# Patient Record
Sex: Female | Born: 1965 | Race: White | Hispanic: No | Marital: Single | Smoking: Never smoker
Health system: Southern US, Community
[De-identification: ages and names within clinical notes are randomized; demographics above are authoritative.]

## PROBLEM LIST (undated history)

## (undated) DIAGNOSIS — F32A Depression, unspecified: Secondary | ICD-10-CM

## (undated) DIAGNOSIS — F329 Major depressive disorder, single episode, unspecified: Secondary | ICD-10-CM

## (undated) HISTORY — DX: Depression, unspecified: F32.A

## (undated) HISTORY — DX: Major depressive disorder, single episode, unspecified: F32.9

## (undated) HISTORY — PX: TUBAL LIGATION: SHX77

---

## 1999-01-28 ENCOUNTER — Other Ambulatory Visit: Admission: RE | Admit: 1999-01-28 | Discharge: 1999-01-28 | Payer: Self-pay | Admitting: Obstetrics and Gynecology

## 1999-05-11 ENCOUNTER — Emergency Department (HOSPITAL_COMMUNITY): Admission: EM | Admit: 1999-05-11 | Discharge: 1999-05-11 | Payer: Self-pay | Admitting: Emergency Medicine

## 1999-06-04 ENCOUNTER — Encounter: Payer: Self-pay | Admitting: Emergency Medicine

## 1999-06-04 ENCOUNTER — Emergency Department (HOSPITAL_COMMUNITY): Admission: EM | Admit: 1999-06-04 | Discharge: 1999-06-04 | Payer: Self-pay | Admitting: Emergency Medicine

## 2005-12-27 ENCOUNTER — Other Ambulatory Visit: Admission: RE | Admit: 2005-12-27 | Discharge: 2005-12-27 | Payer: Self-pay | Admitting: Family Medicine

## 2009-04-14 ENCOUNTER — Other Ambulatory Visit: Admission: RE | Admit: 2009-04-14 | Discharge: 2009-04-14 | Payer: Self-pay | Admitting: Family Medicine

## 2009-06-18 ENCOUNTER — Emergency Department (HOSPITAL_COMMUNITY): Admission: EM | Admit: 2009-06-18 | Discharge: 2009-06-18 | Payer: Self-pay | Admitting: Emergency Medicine

## 2014-01-11 ENCOUNTER — Other Ambulatory Visit: Payer: Self-pay | Admitting: Obstetrics and Gynecology

## 2014-01-11 DIAGNOSIS — R928 Other abnormal and inconclusive findings on diagnostic imaging of breast: Secondary | ICD-10-CM

## 2014-01-18 ENCOUNTER — Ambulatory Visit
Admission: RE | Admit: 2014-01-18 | Discharge: 2014-01-18 | Disposition: A | Discharge status: Home / Self Care | Payer: 59 | Source: Ambulatory Visit | Attending: Obstetrics and Gynecology | Admitting: Obstetrics and Gynecology

## 2014-01-18 ENCOUNTER — Other Ambulatory Visit: Payer: Self-pay | Admitting: Obstetrics and Gynecology

## 2014-01-18 DIAGNOSIS — R928 Other abnormal and inconclusive findings on diagnostic imaging of breast: Secondary | ICD-10-CM

## 2014-01-18 DIAGNOSIS — R921 Mammographic calcification found on diagnostic imaging of breast: Secondary | ICD-10-CM

## 2014-01-25 ENCOUNTER — Ambulatory Visit
Admission: RE | Admit: 2014-01-25 | Discharge: 2014-01-25 | Disposition: A | Discharge status: Home / Self Care | Payer: 59 | Source: Ambulatory Visit | Attending: Obstetrics and Gynecology | Admitting: Obstetrics and Gynecology

## 2014-01-25 DIAGNOSIS — R921 Mammographic calcification found on diagnostic imaging of breast: Secondary | ICD-10-CM

## 2014-01-25 HISTORY — PX: BREAST BIOPSY: SHX20

## 2015-12-16 ENCOUNTER — Other Ambulatory Visit: Payer: Self-pay | Admitting: Orthopedic Surgery

## 2015-12-16 DIAGNOSIS — M25571 Pain in right ankle and joints of right foot: Secondary | ICD-10-CM

## 2015-12-25 ENCOUNTER — Ambulatory Visit
Admission: RE | Admit: 2015-12-25 | Discharge: 2015-12-25 | Disposition: A | Discharge status: Home / Self Care | Payer: 59 | Source: Ambulatory Visit | Attending: Orthopedic Surgery | Admitting: Orthopedic Surgery

## 2015-12-25 DIAGNOSIS — M25571 Pain in right ankle and joints of right foot: Secondary | ICD-10-CM

## 2016-12-15 ENCOUNTER — Ambulatory Visit (INDEPENDENT_AMBULATORY_CARE_PROVIDER_SITE_OTHER): Payer: 59 | Admitting: Family Medicine

## 2016-12-15 DIAGNOSIS — M7660 Achilles tendinitis, unspecified leg: Secondary | ICD-10-CM

## 2016-12-15 NOTE — Progress Notes (Signed)
Chief complaint: Bilateral heel pain 1.5 years  History of present illness: Debra Boone is a 51 year old Caucasian female, who presents to the sports medicine office today with chief complaint of bilateral heel pain. She reports that symptoms started approximately 1.5 years ago. She reports of specific incidents as cause of pain. She reports that she was trying to lose weight, started jump roping and after the first swimming noted pain along the posterior aspect of both of her heels, preventing her to do further jump roping. She points to pain being worse in the right heel versus the left heel. She describes the pain as a sharp, jabbing, and knifelike burning pain. She reports continued intermittent pain since that time. She reports that she normally wears tennis shoes, does not wear any high heels. She reports that she cannot walk barefoot secondary to pain. She reports that she did have orthopedic evaluation, had x-ray imaging which showed no obvious bony abnormality, then had MRI which showed Achilles tendinopathy. She was given nitroglycerin patch, which she reports she used a whole patch here she reports she tried this for one month, with no improvement in symptoms. She reports that she tried any additional medication, unsure of the name of the medication, but reports no improvement in symptoms. She reports that her primary physician started her on gabapentin, initially at 100 mg, but now is at 300 mg. She reports no improvement in symptoms in regards to this. She does not report of any numbness, tingling, or weakness in her bilateral lower extremities. She does not report of any radiation of pain. Describes the pain as a 5/10. The reports of no interval improvement in symptoms or worsening of symptoms over the last 1.5 years. She does not report of any previous injury to her ankle or feet bilaterally. She does not report of any  warmth, erythema, ecchymosis, or effusion.  Past medical  history: Anxiety Depression Obesity GERD Vitamin D deficiency  Past surgical history: Tubal ligation   Family history: Does not report a family history hypertension, type 2 diabetes, hyperlipidemia mother or father's side of family  Social history: She is not report of any current tobacco, alcohol, or illicit drug use, is married, has 2 children, does mostly desk work for [employer]  Allergies: Paxil, rash  Review of systems: As stated above  Physical exam: Vital signs reviewed and are documented in chart General: Alert, oriented, appears stated age, in no apparent distress HEENT: Moist oral mucosa Respiratory: Normal respirations, able to speak in full sentences Cardiac: Normal peripheral pulses, regular rate Integumentary: No rashes on visible skin Neurologic: Strength 5/5 in bilateral lower extremity, sensation 2+ in bilateral lower extremities Psychiatric: Appropriate affect and mood Musculoskeletal: Inspection of bilateral feet reveals obvious Haglund deformity in bilateral feet, no obvious deformity or muscular atrophy, no warmth, erythema, ecchymosis, or effusion noted, she reports tenderness to deep palpation over the posterior aspect of calcaneus where the Achilles inserts into the calcaneus, no pain elicited with ankle dorsiflexion, plantar flexion, inversion, eversion, she does have normal range of motion of her ankle, no tenderness to palpation along the plantar aspect of her feet or dorsal aspect of her feet, as well as as an medial and lateral malleolus, calcaneal squeeze test negative, no pain along proximal Achilles and along the gastrocnemius and soleus muscles bilaterally  Assessment and plan: 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  Bilateral insertional Achilles tendinopathy -Discussed pain is most likely related to insertional Achilles tendinopathy, discussed scar tissue formation and  calcium deposit along the Achilles  were it inserts into the calcaneus, discussed this is origin of bilateral Haglund's deformity -Discussed heel lifts, will give 5/16th in the left -Discussed continue nitroglycerin protocol, discussed that she did not give the nitroglycerin enough time for full effectiveness, discussed cutting nitroglycerin patch into 1/4, rotating sites daily to minimize side effect of rash, discussed monitor for any symptoms of headaches discussed use for 3 months -Discussed eccentric exercises and heel lifts, to do 3 sets of 15 on each side daily -She will follow-up in 4-6 weeks, we'll plan for ultrasound at that time  Otherwise, she will return sooner on an as-needed basis.  Haynes Kernshristopher Lake, MD Primary Care Sports Medicine Fellow [facility]

## 2016-12-15 NOTE — Patient Instructions (Addendum)

## 2016-12-21 ENCOUNTER — Other Ambulatory Visit: Payer: Self-pay | Admitting: Obstetrics and Gynecology

## 2016-12-21 DIAGNOSIS — R921 Mammographic calcification found on diagnostic imaging of breast: Secondary | ICD-10-CM

## 2016-12-23 ENCOUNTER — Ambulatory Visit
Admission: RE | Admit: 2016-12-23 | Discharge: 2016-12-23 | Disposition: A | Discharge status: Home / Self Care | Payer: 59 | Source: Ambulatory Visit | Attending: Obstetrics and Gynecology | Admitting: Obstetrics and Gynecology

## 2016-12-23 DIAGNOSIS — R921 Mammographic calcification found on diagnostic imaging of breast: Secondary | ICD-10-CM

## 2017-01-12 ENCOUNTER — Ambulatory Visit: Payer: 59 | Admitting: Family Medicine

## 2017-11-28 ENCOUNTER — Other Ambulatory Visit: Payer: Self-pay | Admitting: Obstetrics and Gynecology

## 2017-11-28 DIAGNOSIS — Z1231 Encounter for screening mammogram for malignant neoplasm of breast: Secondary | ICD-10-CM

## 2018-01-02 ENCOUNTER — Ambulatory Visit
Admission: RE | Admit: 2018-01-02 | Discharge: 2018-01-02 | Disposition: A | Discharge status: Home / Self Care | Payer: 59 | Source: Ambulatory Visit | Attending: Obstetrics and Gynecology | Admitting: Obstetrics and Gynecology

## 2018-01-02 DIAGNOSIS — Z1231 Encounter for screening mammogram for malignant neoplasm of breast: Secondary | ICD-10-CM

## 2018-08-11 ENCOUNTER — Other Ambulatory Visit: Payer: Self-pay | Admitting: Family Medicine

## 2018-08-11 DIAGNOSIS — N183 Chronic kidney disease, stage 3 unspecified: Secondary | ICD-10-CM

## 2018-09-12 ENCOUNTER — Other Ambulatory Visit: Payer: 59

## 2018-12-18 IMAGING — MG DIGITAL SCREENING BILATERAL MAMMOGRAM WITH CAD
4 series · 4 of 4 positions shown · non-contrast
Comparison: Previous exam(s).

CLINICAL DATA: Screening.

EXAM:
DIGITAL SCREENING BILATERAL MAMMOGRAM WITH CAD

[L CC]
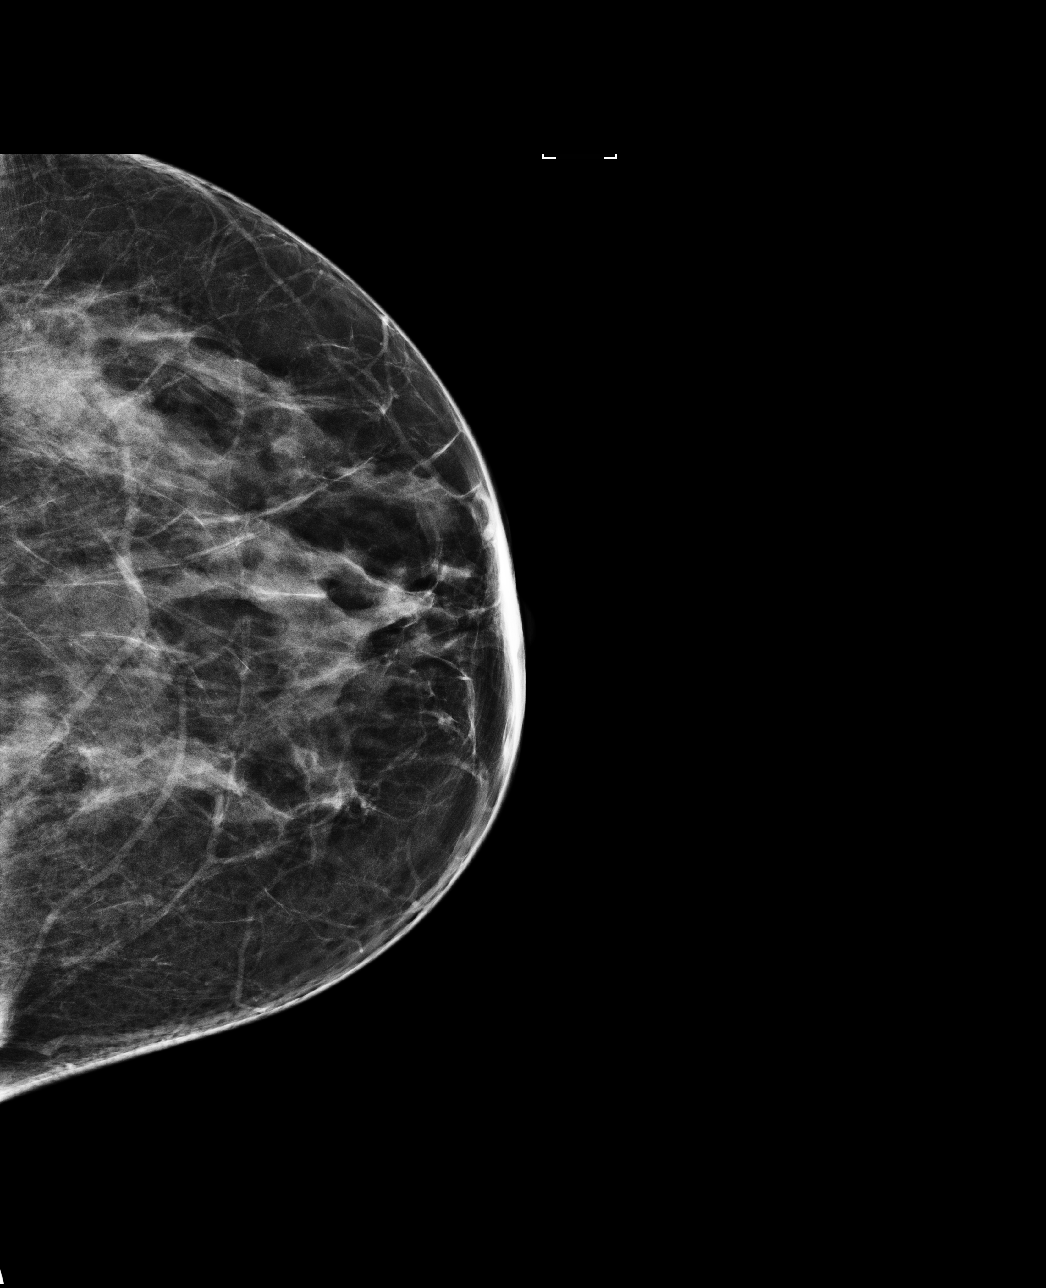

[R MLO]
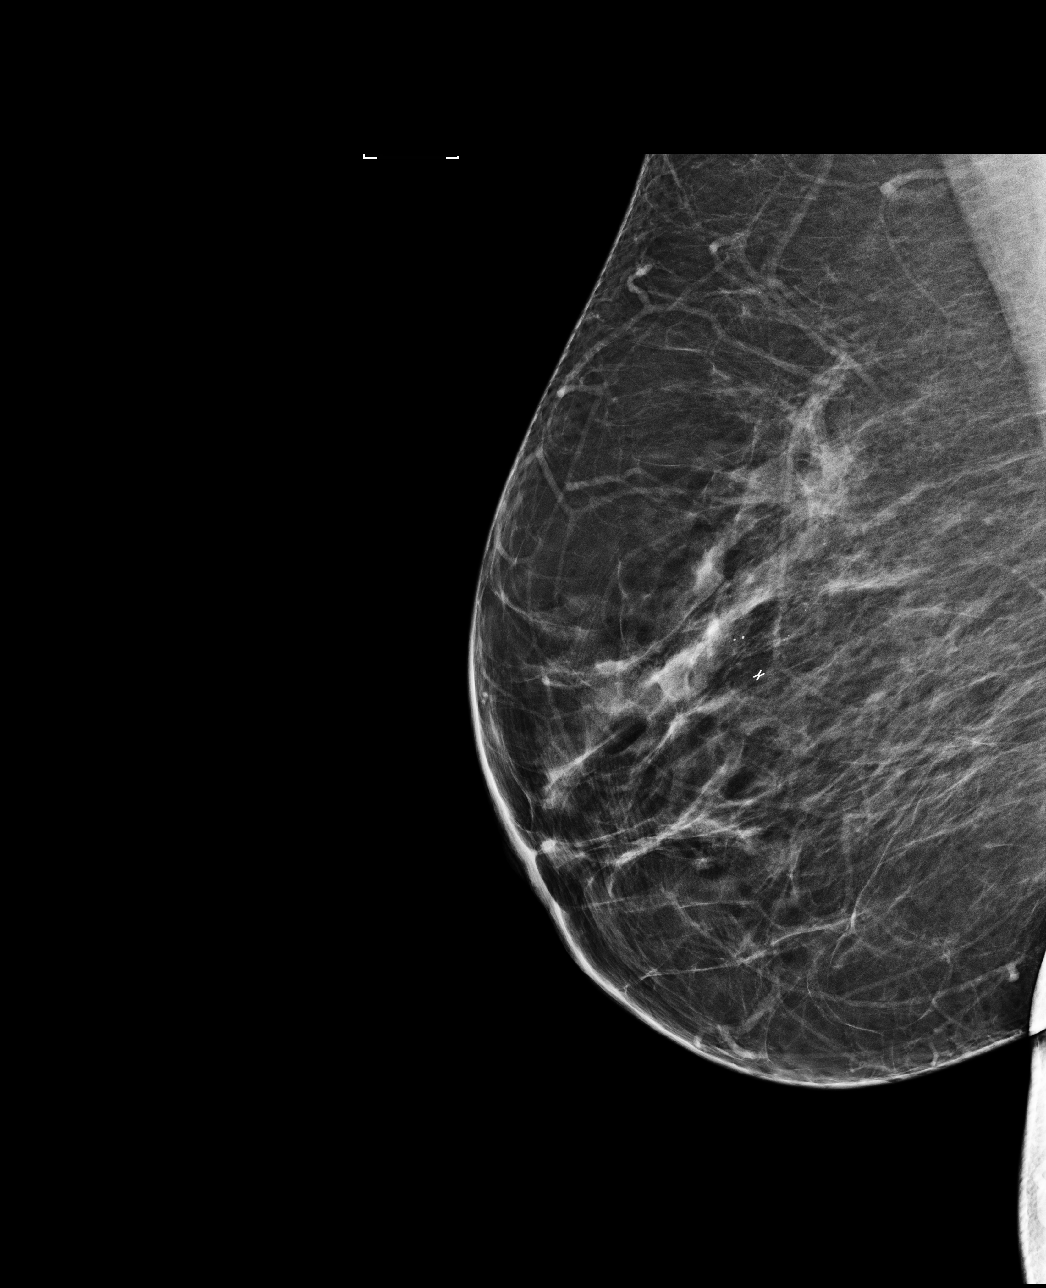

[L MLO]
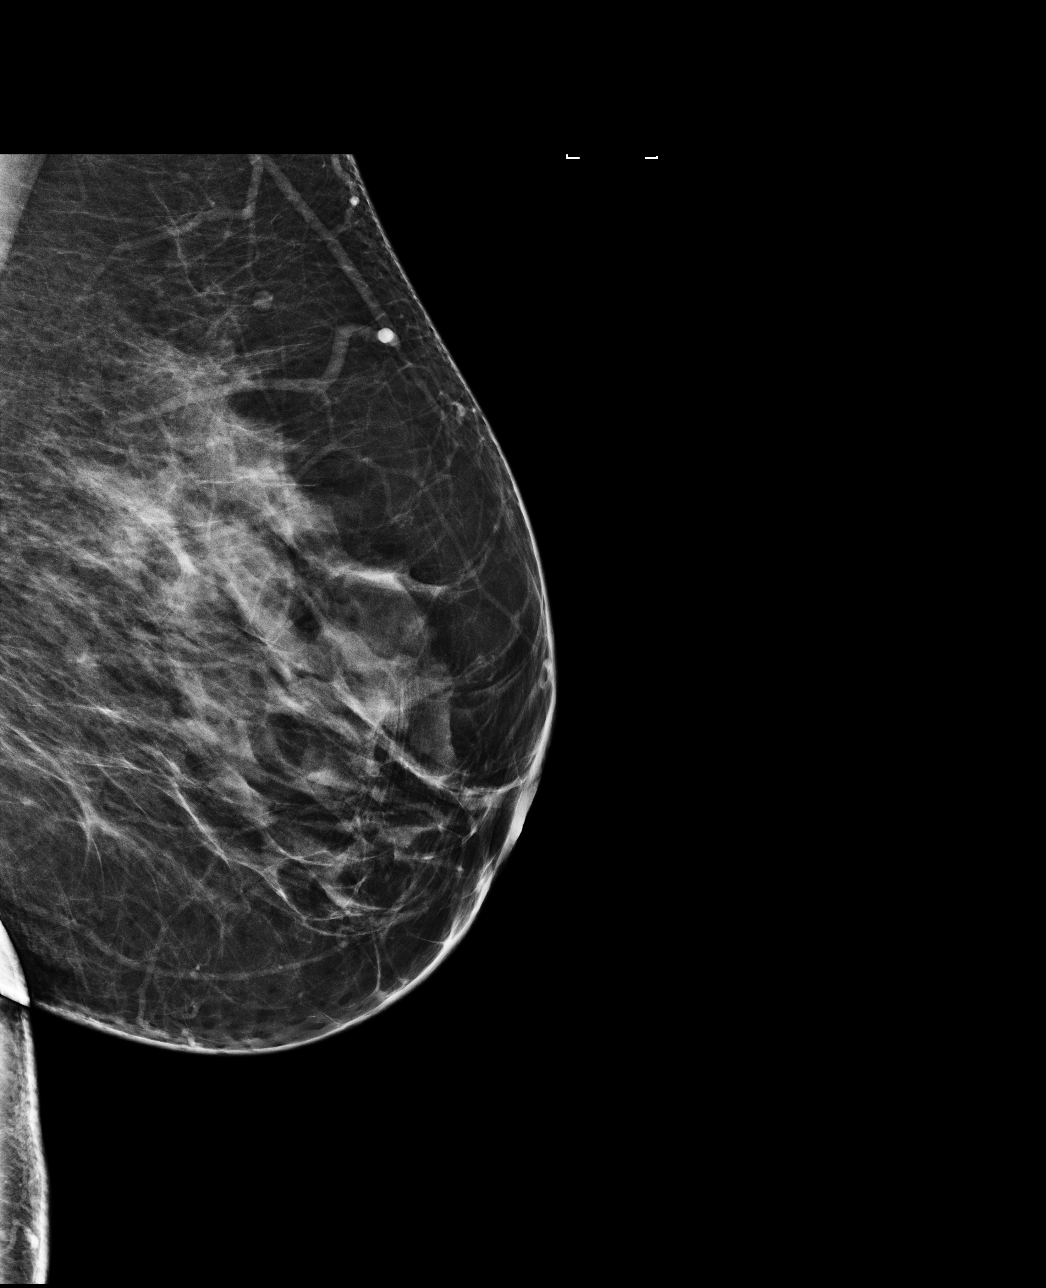

[R CC]
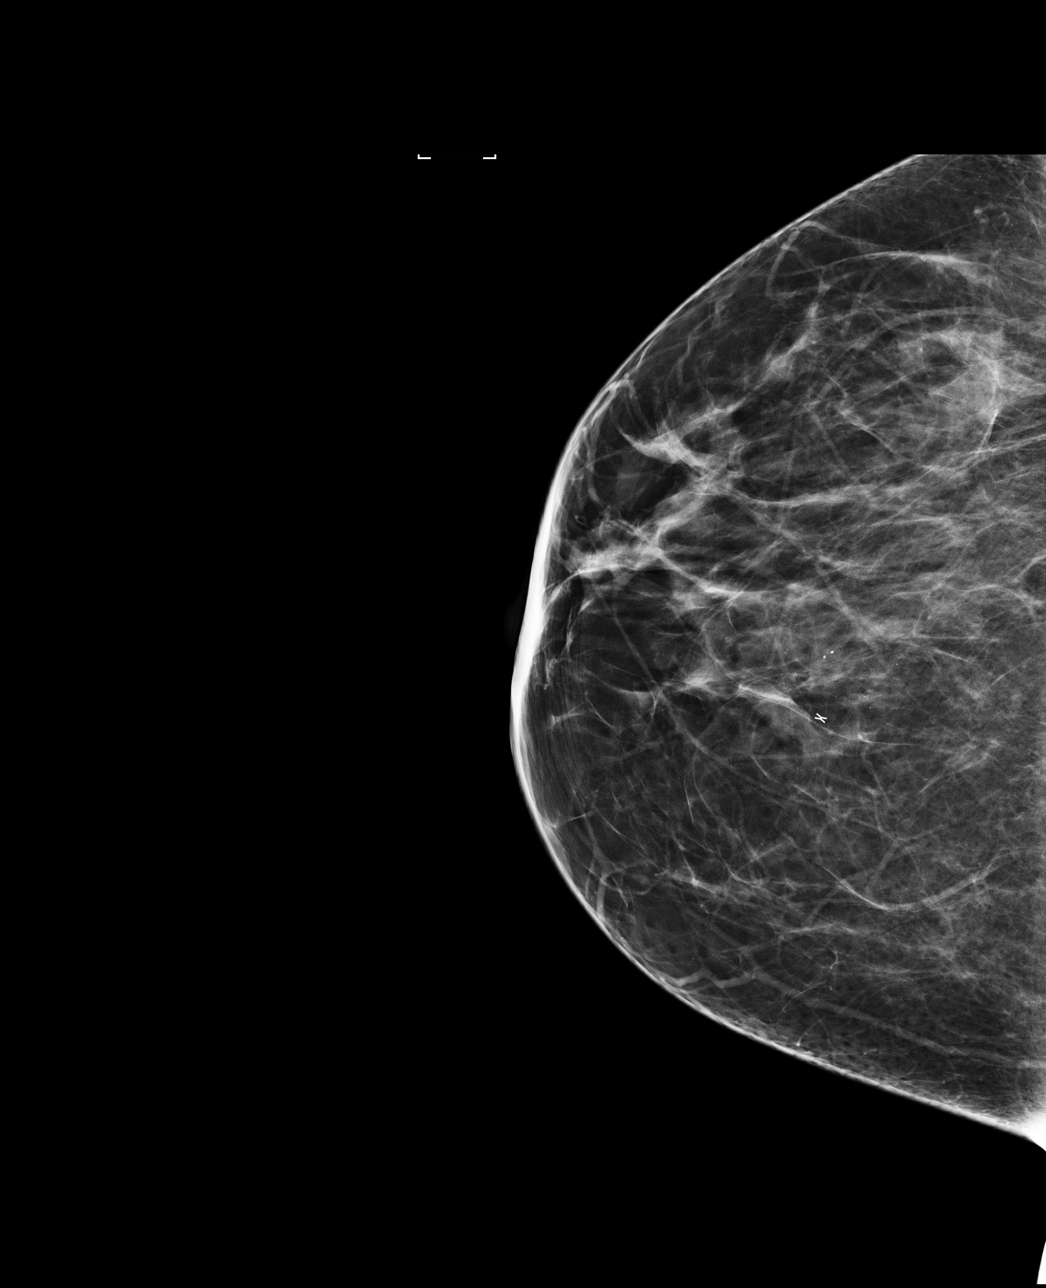

[4 of 4 positions shown; findings below may reference images not displayed]

ACR Breast Density Category c: The breast tissue is heterogeneously
dense, which may obscure small masses.
FINDINGS: There are no findings suspicious for malignancy. Images were
processed with CAD.
IMPRESSION: No mammographic evidence of malignancy. A result letter of this
screening mammogram will be mailed directly to the patient.

RECOMMENDATION:
Screening mammogram in one year. (Code:YJ-[id number])

BI-RADS CATEGORY  1: Negative.

## 2019-03-02 ENCOUNTER — Other Ambulatory Visit: Payer: Self-pay

## 2019-03-02 ENCOUNTER — Emergency Department (HOSPITAL_COMMUNITY)
Admission: EM | Admit: 2019-03-02 | Discharge: 2019-03-02 | Disposition: A | Discharge status: Home / Self Care | Payer: No Typology Code available for payment source | Attending: Emergency Medicine | Admitting: Emergency Medicine

## 2019-03-02 DIAGNOSIS — Y92009 Unspecified place in unspecified non-institutional (private) residence as the place of occurrence of the external cause: Secondary | ICD-10-CM | POA: Diagnosis not present

## 2019-03-02 DIAGNOSIS — S46912A Strain of unspecified muscle, fascia and tendon at shoulder and upper arm level, left arm, initial encounter: Secondary | ICD-10-CM | POA: Diagnosis not present

## 2019-03-02 DIAGNOSIS — Y999 Unspecified external cause status: Secondary | ICD-10-CM | POA: Diagnosis not present

## 2019-03-02 DIAGNOSIS — M62838 Other muscle spasm: Secondary | ICD-10-CM | POA: Diagnosis not present

## 2019-03-02 DIAGNOSIS — Y9384 Activity, sleeping: Secondary | ICD-10-CM | POA: Diagnosis not present

## 2019-03-02 DIAGNOSIS — S4992XA Unspecified injury of left shoulder and upper arm, initial encounter: Secondary | ICD-10-CM | POA: Diagnosis present

## 2019-03-02 DIAGNOSIS — X501XXA Overexertion from prolonged static or awkward postures, initial encounter: Secondary | ICD-10-CM | POA: Diagnosis not present

## 2019-03-02 DIAGNOSIS — Z79899 Other long term (current) drug therapy: Secondary | ICD-10-CM | POA: Diagnosis not present

## 2019-03-02 MED ORDER — CYCLOBENZAPRINE HCL 10 MG PO TABS: 10.0000 mg | ORAL_TABLET | Freq: Two times a day (BID) | ORAL | 0 refills | Status: AC | PRN

## 2019-03-02 MED ORDER — PREDNISONE 20 MG PO TABS: 40.0000 mg | ORAL_TABLET | Freq: Every day | ORAL | 0 refills | Status: AC

## 2019-03-02 MED ORDER — TRAMADOL HCL 50 MG PO TABS: 50.0000 mg | ORAL_TABLET | Freq: Four times a day (QID) | ORAL | 0 refills | Status: AC | PRN

## 2019-03-02 NOTE — Discharge Instructions (Addendum)
You may continue to apply heat to your left shoulder.  I have prescribed a short course of steroids to help with your pain, please take these as prescribed.  Please be aware steroids can cause flushness, insomnia, appetite changes.  Muscle relaxers have also been added to your prescription, these can make you drowsy do not drink alcohol or drive while taking this medication.  A short course of tramadol has been given to you, please use this for severe pain.

## 2019-03-02 NOTE — ED Triage Notes (Signed)
Pt endorses left shoulder pain radiating down the left arm that began yesterday stating "it feels like I slept on it wrong" Denies CP, dizziness, shob, jaw pain or back pain. No cardiac sx. VSS. CMS intact. Worse with palpation and movement.

## 2019-03-02 NOTE — ED Provider Notes (Signed)
[facility] Provider Note   CSN: [id number] Arrival date & time: 03/02/19  0920     History   Chief Complaint Chief Complaint  Patient presents with  . Shoulder Pain    HPI Debra Boone is a 53 y.o. female.     53 y.o female with no PMH presents to the ED with a chief complaint of left trapezius pain x yesterday.  Patient reports waking up yesterday, when she then felt a sharp sensation originating from the back of her trapezius radiating onto her left shoulder radiating onto her elbow and left hand.  She reports she feels like there is a bump along her trapezius, states this is worse with movement of her arm.  Reports taking some gabapentin, tanzanadine, Advil, heat and ice without improvement in symptoms.  She reports that she feels something hurting on the back of her trap muscle, continues to spasm and radiates down her arm.  She denies any weakness, dizziness, chest pain or shortness of breath.  No trauma.  The history is provided by the patient.    Past Medical History:  Diagnosis Date  . Depression     There are no active problems to display for this patient.   Past Surgical History:  Procedure Laterality Date  . BREAST BIOPSY Right 01/25/2014  . TUBAL LIGATION       OB History   No obstetric history on file.      Home Medications    Prior to Admission medications   Medication Sig Start Date End Date Taking? Authorizing Provider  buPROPion (WELLBUTRIN XL) 300 MG 24 hr tablet Take 300 mg by mouth daily.    [provider]  cyclobenzaprine (FLEXERIL) 10 MG tablet Take 1 tablet (10 mg total) by mouth 2 (two) times daily as needed for up to 5 days for muscle spasms. 03/02/19 03/07/19  Claude MangesSoto, Vanderbilt Ranieri, PA-C  diclofenac (VOLTAREN) 75 MG EC tablet Take 75 mg by mouth 2 (two) times daily. 11/09/16   [provider]  escitalopram (LEXAPRO) 20 MG tablet Take 20 mg by mouth daily.    [provider]   gabapentin (NEURONTIN) 300 MG capsule Take 300 mg by mouth 1 day or 1 dose. 11/09/16   [provider]  Multiple Vitamin (MULTI VITAMIN DAILY PO) Take by mouth.    [provider]  norgestrel-ethinyl estradiol (CRYSELLE-28) 0.3-30 MG-MCG tablet Take 1 tablet by mouth daily.    [provider]  predniSONE (DELTASONE) 20 MG tablet Take 2 tablets (40 mg total) by mouth daily for 5 days. 03/02/19 03/07/19  Claude MangesSoto, Sherrell Weir, PA-C  traMADol (ULTRAM) 50 MG tablet Take 1 tablet (50 mg total) by mouth every 6 (six) hours as needed for up to 3 days. 03/02/19 03/05/19  Claude MangesSoto, Avianna Moynahan, PA-C  Vitamin D, Ergocalciferol, (DRISDOL) 50000 units CAPS capsule Take 50,000 Units by mouth every 7 (seven) days.    [provider]    Family History Family History  Problem Relation Age of Onset  . Breast cancer Neg Hx     Social History Social History   Tobacco Use  . Smoking status: Never Smoker  Substance Use Topics  . Alcohol use: Not on file  . Drug use: Not on file     Allergies   Paxil [paroxetine hcl]   Review of Systems Review of Systems  Constitutional: Negative for fever.  Musculoskeletal: Positive for arthralgias, myalgias and neck pain. Negative for neck stiffness.  Neurological: Negative for  weakness, light-headedness and headaches.     Physical Exam Updated Vital Signs BP (!) 145/76 (BP Location: Right Arm)   Pulse 71   Temp 98.2 F (36.8 C) (Oral)   Resp 17   LMP  (LMP Unknown)   SpO2 97%   Physical Exam Vitals signs and nursing note reviewed.  Constitutional:      General: She is not in acute distress.    Appearance: She is well-developed.  HENT:     Head: Normocephalic and atraumatic.     Mouth/Throat:     Pharynx: No oropharyngeal exudate.  Eyes:     Pupils: Pupils are equal, round, and reactive to light.  Neck:     Musculoskeletal: Normal range of motion.  Cardiovascular:     Rate and Rhythm: Regular rhythm.     Heart sounds:  Normal heart sounds.  Pulmonary:     Effort: Pulmonary effort is normal. No respiratory distress.     Breath sounds: Normal breath sounds.  Abdominal:     General: Bowel sounds are normal. There is no distension.     Palpations: Abdomen is soft.     Tenderness: There is no abdominal tenderness.  Musculoskeletal:        General: No tenderness or deformity.       Back:     Right lower leg: No edema.     Left lower leg: No edema.     Comments: No midline tenderness.  Tenderness along muscle trapezius area, seems somewhat spasming.  No changes in the skin.  Does have full range of motion of her left shoulder without any crepitus on my exam.  Also is present, neurologically intact.  Skin:    General: Skin is warm and dry.  Neurological:     Mental Status: She is alert and oriented to person, place, and time.      ED Treatments / Results  Labs (all labs ordered are listed, but only abnormal results are displayed) Labs Reviewed - No data to display  EKG None  Radiology No results found.  Procedures Procedures (including critical care time)  Medications Ordered in ED Medications - No data to display   Initial Impression / Assessment and Plan / ED Course  I have reviewed the triage vital signs and the nursing notes.  Pertinent labs & imaging results that were available during my care of the patient were reviewed by me and considered in my medical decision making (see chart for details).       Patient with no pertinent past medical history presents to the ED with complaints of left shoulder pain, symptoms began yesterday after she reports sleeping wrong, states she has taken gabapentin, Aleve, several medications without improvement in symptoms.  Patient does have active muscle spasms to her trapezius area.  She is neurologically intact, denies any chest pain, shortness of breath, weakness.  Will prescribe patient a short course of steroids along with tramadol and Flexeril to  help with her spasms.  She denies any prior history of diabetes.  Patient was not given any medication while in the ED as she reports that she is currently driving back home, appears in significant amount of discomfort due to muscle spasms.  Vitals are within normal limits, no tachycardia, no hypoxia.  Patient understands and agrees with management, return precautions provided at length.   Portions of this note were generated with Lobby[employer]. Dictation errors may occur despite best attempts at proofreading.  Final Clinical Impressions(s) / ED  Diagnoses   Final diagnoses:  Strain of left shoulder, initial encounter    ED Discharge Orders         Ordered    predniSONE (DELTASONE) 20 MG tablet  Daily     03/02/19 1022    cyclobenzaprine (FLEXERIL) 10 MG tablet  2 times daily PRN     03/02/19 1022    traMADol (ULTRAM) 50 MG tablet  Every 6 hours PRN     03/02/19 1022           Claude Manges, PA-C 03/02/19 1025    Margarita Grizzle, MD 03/05/19 1249

## 2019-03-30 ENCOUNTER — Other Ambulatory Visit: Payer: Self-pay

## 2019-03-30 DIAGNOSIS — Z20822 Contact with and (suspected) exposure to covid-19: Secondary | ICD-10-CM

## 2019-04-02 LAB — NOVEL CORONAVIRUS, NAA: SARS-CoV-2, NAA: DETECTED — AB

## 2022-06-24 ENCOUNTER — Other Ambulatory Visit (HOSPITAL_BASED_OUTPATIENT_CLINIC_OR_DEPARTMENT_OTHER): Payer: Self-pay

## 2022-06-24 MED ORDER — WEGOVY 0.5 MG/0.5ML ~~LOC~~ SOAJ: 0.5000 mg | SUBCUTANEOUS | 0 refills | Status: AC

## 2022-06-24 MED ORDER — WEGOVY 0.25 MG/0.5ML ~~LOC~~ SOAJ
0.2500 mg | SUBCUTANEOUS | 0 refills | Status: AC
  Filled 2022-06-24: qty 2, 28d supply, fill #0

## 2022-06-30 ENCOUNTER — Other Ambulatory Visit (HOSPITAL_BASED_OUTPATIENT_CLINIC_OR_DEPARTMENT_OTHER): Payer: Self-pay

## 2022-06-30 ENCOUNTER — Other Ambulatory Visit: Payer: Self-pay

## 2022-07-08 ENCOUNTER — Other Ambulatory Visit (HOSPITAL_BASED_OUTPATIENT_CLINIC_OR_DEPARTMENT_OTHER): Payer: Self-pay

## 2022-09-13 ENCOUNTER — Other Ambulatory Visit: Payer: Self-pay

## 2022-09-13 ENCOUNTER — Other Ambulatory Visit (HOSPITAL_COMMUNITY): Payer: Self-pay

## 2022-09-13 MED ORDER — ZEPBOUND 10 MG/0.5ML ~~LOC~~ SOAJ
10.0000 mg | SUBCUTANEOUS | 0 refills | Status: DC
  Filled 2022-09-13: qty 2, 28d supply, fill #0

## 2022-10-06 ENCOUNTER — Other Ambulatory Visit (HOSPITAL_COMMUNITY): Payer: Self-pay

## 2022-10-06 MED ORDER — ZEPBOUND 10 MG/0.5ML ~~LOC~~ SOAJ
10.0000 mg | SUBCUTANEOUS | 1 refills | Status: AC
  Filled 2022-10-06: qty 2, 28d supply, fill #0

## 2022-11-02 ENCOUNTER — Other Ambulatory Visit (HOSPITAL_COMMUNITY): Payer: Self-pay

## 2022-11-02 MED ORDER — ZEPBOUND 10 MG/0.5ML ~~LOC~~ SOAJ
10.0000 mg | SUBCUTANEOUS | 1 refills | Status: AC
  Filled 2022-11-02: qty 2, 28d supply, fill #0

## 2022-11-09 ENCOUNTER — Other Ambulatory Visit (HOSPITAL_COMMUNITY): Payer: Self-pay

## 2022-11-10 ENCOUNTER — Other Ambulatory Visit (HOSPITAL_COMMUNITY): Payer: Self-pay

## 2022-11-10 MED ORDER — ZEPBOUND 12.5 MG/0.5ML ~~LOC~~ SOAJ
12.5000 mg | SUBCUTANEOUS | 0 refills | Status: DC
  Filled 2022-11-10: qty 2, 28d supply, fill #0

## 2022-11-10 MED ORDER — OMEPRAZOLE 40 MG PO CPDR
40.0000 mg | DELAYED_RELEASE_CAPSULE | Freq: Every morning | ORAL | 2 refills | Status: AC
  Filled 2022-11-10: qty 30, 30d supply, fill #0
  Filled 2022-12-27: qty 30, 30d supply, fill #1

## 2022-11-12 ENCOUNTER — Other Ambulatory Visit (HOSPITAL_COMMUNITY): Payer: Self-pay

## 2022-12-08 ENCOUNTER — Other Ambulatory Visit: Payer: Self-pay

## 2022-12-08 MED ORDER — ZEPBOUND 12.5 MG/0.5ML ~~LOC~~ SOAJ
12.5000 mg | SUBCUTANEOUS | 0 refills | Status: DC
  Filled 2022-12-08: qty 2, 28d supply, fill #0

## 2022-12-14 ENCOUNTER — Other Ambulatory Visit: Payer: Self-pay

## 2022-12-14 MED ORDER — ZEPBOUND 12.5 MG/0.5ML ~~LOC~~ SOAJ
12.5000 mg | SUBCUTANEOUS | 0 refills | Status: DC
  Filled 2022-12-14 – 2023-01-05 (×3): qty 2, 28d supply, fill #0

## 2022-12-28 ENCOUNTER — Encounter: Payer: Self-pay | Admitting: Pharmacist

## 2022-12-28 ENCOUNTER — Other Ambulatory Visit: Payer: Self-pay

## 2022-12-31 ENCOUNTER — Other Ambulatory Visit: Payer: Self-pay

## 2023-01-06 ENCOUNTER — Other Ambulatory Visit: Payer: Self-pay

## 2023-01-25 ENCOUNTER — Other Ambulatory Visit: Payer: Self-pay

## 2023-01-25 MED ORDER — ZEPBOUND 12.5 MG/0.5ML ~~LOC~~ SOAJ
12.5000 mg | SUBCUTANEOUS | 1 refills | Status: AC
  Filled 2023-01-25: qty 2, 28d supply, fill #0

## 2023-02-16 ENCOUNTER — Other Ambulatory Visit: Payer: Self-pay

## 2023-02-16 MED ORDER — ZEPBOUND 12.5 MG/0.5ML ~~LOC~~ SOAJ
12.5000 mg | SUBCUTANEOUS | 0 refills | Status: AC
  Filled 2023-02-16: qty 2, 28d supply, fill #0

## 2023-03-08 ENCOUNTER — Other Ambulatory Visit: Payer: Self-pay

## 2023-03-08 MED ORDER — ZEPBOUND 15 MG/0.5ML ~~LOC~~ SOAJ
15.0000 mg | SUBCUTANEOUS | 1 refills | Status: AC
  Filled 2023-03-24 – 2023-03-25 (×2): qty 2, 28d supply, fill #0
  Filled 2023-09-28 – 2023-10-26 (×2): qty 2, 28d supply, fill #1

## 2023-03-24 ENCOUNTER — Other Ambulatory Visit: Payer: Self-pay

## 2023-03-25 ENCOUNTER — Other Ambulatory Visit: Payer: Self-pay

## 2023-04-19 ENCOUNTER — Other Ambulatory Visit: Payer: Self-pay

## 2023-04-19 MED ORDER — ZEPBOUND 15 MG/0.5ML ~~LOC~~ SOAJ
15.0000 mg | SUBCUTANEOUS | 1 refills | Status: AC
  Filled 2023-04-19 – 2023-04-26 (×3): qty 2, 28d supply, fill #0
  Filled 2023-05-22: qty 2, 28d supply, fill #1

## 2023-04-25 ENCOUNTER — Other Ambulatory Visit: Payer: Self-pay

## 2023-04-26 ENCOUNTER — Other Ambulatory Visit: Payer: Self-pay

## 2023-04-27 ENCOUNTER — Other Ambulatory Visit: Payer: Self-pay

## 2023-05-22 ENCOUNTER — Other Ambulatory Visit: Payer: Self-pay

## 2023-05-23 ENCOUNTER — Other Ambulatory Visit: Payer: Self-pay

## 2023-06-15 ENCOUNTER — Other Ambulatory Visit: Payer: Self-pay

## 2023-06-15 MED ORDER — ZEPBOUND 15 MG/0.5ML ~~LOC~~ SOAJ
15.0000 mg | SUBCUTANEOUS | 1 refills | Status: AC
  Filled 2023-06-19: qty 0.5, 7d supply, fill #0
  Filled 2024-01-30: qty 0.5, 7d supply, fill #1
  Filled 2024-01-30: qty 2, 28d supply, fill #1
  Filled 2024-02-28 – 2024-02-29 (×2): qty 2, 28d supply, fill #2

## 2023-06-19 ENCOUNTER — Other Ambulatory Visit: Payer: Self-pay

## 2023-06-20 ENCOUNTER — Other Ambulatory Visit: Payer: Self-pay

## 2023-07-06 ENCOUNTER — Other Ambulatory Visit: Payer: Self-pay

## 2023-07-06 MED ORDER — ZEPBOUND 15 MG/0.5ML ~~LOC~~ SOAJ
15.0000 mg | SUBCUTANEOUS | 1 refills | Status: AC
  Filled 2023-07-06: qty 2, 28d supply, fill #0
  Filled 2023-07-29: qty 2, 28d supply, fill #1

## 2023-07-29 ENCOUNTER — Other Ambulatory Visit: Payer: Self-pay

## 2023-08-24 ENCOUNTER — Other Ambulatory Visit: Payer: Self-pay

## 2023-08-24 MED ORDER — ZEPBOUND 15 MG/0.5ML ~~LOC~~ SOAJ
15.0000 mg | SUBCUTANEOUS | 1 refills | Status: DC
  Filled 2023-08-24 – 2023-08-27 (×2): qty 2, 28d supply, fill #0
  Filled 2023-09-28: qty 2, 28d supply, fill #1

## 2023-08-27 ENCOUNTER — Other Ambulatory Visit: Payer: Self-pay

## 2023-09-28 ENCOUNTER — Other Ambulatory Visit: Payer: Self-pay

## 2023-10-03 ENCOUNTER — Other Ambulatory Visit: Payer: Self-pay

## 2023-10-26 ENCOUNTER — Other Ambulatory Visit: Payer: Self-pay

## 2023-10-26 MED ORDER — ZEPBOUND 15 MG/0.5ML ~~LOC~~ SOAJ
15.0000 mg | SUBCUTANEOUS | 1 refills | Status: AC
  Filled 2023-10-26 – 2024-01-05 (×3): qty 2, 28d supply, fill #0
  Filled 2024-02-29 – 2024-05-21 (×2): qty 2, 28d supply, fill #1

## 2023-10-31 ENCOUNTER — Other Ambulatory Visit: Payer: Self-pay

## 2023-11-16 ENCOUNTER — Other Ambulatory Visit: Payer: Self-pay

## 2023-11-16 MED ORDER — ZEPBOUND 15 MG/0.5ML ~~LOC~~ SOAJ
15.0000 mg | SUBCUTANEOUS | 1 refills | Status: AC
  Filled 2023-11-16 – 2024-03-26 (×4): qty 2, 28d supply, fill #0

## 2023-11-17 ENCOUNTER — Other Ambulatory Visit: Payer: Self-pay

## 2023-11-29 ENCOUNTER — Other Ambulatory Visit: Payer: Self-pay

## 2023-12-07 ENCOUNTER — Other Ambulatory Visit: Payer: Self-pay

## 2024-01-06 ENCOUNTER — Other Ambulatory Visit: Payer: Self-pay

## 2024-01-06 ENCOUNTER — Other Ambulatory Visit (HOSPITAL_COMMUNITY): Payer: Self-pay

## 2024-01-30 ENCOUNTER — Other Ambulatory Visit: Payer: Self-pay

## 2024-01-30 ENCOUNTER — Other Ambulatory Visit (HOSPITAL_BASED_OUTPATIENT_CLINIC_OR_DEPARTMENT_OTHER): Payer: Self-pay

## 2024-02-15 ENCOUNTER — Other Ambulatory Visit: Payer: Self-pay

## 2024-02-15 MED ORDER — ZEPBOUND 15 MG/0.5ML ~~LOC~~ SOAJ
15.0000 mg | SUBCUTANEOUS | 2 refills | Status: AC
  Filled 2024-02-15 – 2024-03-05 (×5): qty 2, 28d supply, fill #0

## 2024-02-28 ENCOUNTER — Other Ambulatory Visit: Payer: Self-pay

## 2024-02-29 ENCOUNTER — Other Ambulatory Visit: Payer: Self-pay

## 2024-03-03 ENCOUNTER — Other Ambulatory Visit: Payer: Self-pay

## 2024-03-05 ENCOUNTER — Other Ambulatory Visit: Payer: Self-pay

## 2024-03-26 ENCOUNTER — Other Ambulatory Visit: Payer: Self-pay

## 2024-03-30 ENCOUNTER — Other Ambulatory Visit: Payer: Self-pay

## 2024-04-23 ENCOUNTER — Other Ambulatory Visit: Payer: Self-pay

## 2024-04-23 MED ORDER — ZEPBOUND 15 MG/0.5ML ~~LOC~~ SOAJ
15.0000 mg | SUBCUTANEOUS | 2 refills | Status: AC
  Filled 2024-04-23: qty 2, 28d supply, fill #0
  Filled 2024-05-21: qty 2, 28d supply, fill #1

## 2024-05-22 ENCOUNTER — Other Ambulatory Visit: Payer: Self-pay

## 2024-05-23 ENCOUNTER — Other Ambulatory Visit: Payer: Self-pay

## 2024-06-13 ENCOUNTER — Ambulatory Visit
Admission: EM | Admit: 2024-06-13 | Discharge: 2024-06-13 | Disposition: A | Discharge status: Home / Self Care | Attending: Emergency Medicine | Admitting: Emergency Medicine

## 2024-06-13 DIAGNOSIS — J069 Acute upper respiratory infection, unspecified: Secondary | ICD-10-CM

## 2024-06-13 MED ORDER — BENZONATATE 100 MG PO CAPS: 100.0000 mg | ORAL_CAPSULE | Freq: Three times a day (TID) | ORAL | 0 refills | Status: AC | PRN

## 2024-06-13 MED ORDER — ALBUTEROL SULFATE HFA 108 (90 BASE) MCG/ACT IN AERS: 1.0000 | INHALATION_SPRAY | Freq: Four times a day (QID) | RESPIRATORY_TRACT | 0 refills | Status: AC | PRN

## 2024-06-13 MED ORDER — PROMETHAZINE-DM 6.25-15 MG/5ML PO SYRP: 5.0000 mL | ORAL_SOLUTION | Freq: Four times a day (QID) | ORAL | 0 refills | Status: AC | PRN

## 2024-06-13 NOTE — ED Triage Notes (Signed)
 Cough, SOB with activity, fever 101.3, headache x 4 days. Taking dyquil.

## 2024-06-13 NOTE — Discharge Instructions (Addendum)
 Use the albuterol  inhaler as directed.  Take the Tessalon  Perles as directed.    Take the Promethazine  DM as directed.  Do not drive, operate machinery, drink alcohol, or perform dangerous activities while taking this medication as it may cause drowsiness.  Follow up with your primary care provider.  Go to the emergency department if you have worsening symptoms.

## 2024-06-13 NOTE — ED Provider Notes (Signed)
 " [facility]    CSN: [id number] Arrival date & time: 06/13/24  0913      History   Chief Complaint Chief Complaint  Patient presents with   Cough   Fever    HPI Debra Boone is a 59 y.o. female.  Patient presents with 4-day history of fever, headache, postnasal drainage, runny nose, cough, shortness of breath.  Her cough is nonproductive.  No OTC medications taken today.  No vomiting or diarrhea. Patient reports history of seasonal asthma but states she has not needed to use an inhaler in the last year.  She previously had an albuterol  inhaler but it has expired.  The history is provided by the patient and medical records.    Past Medical History:  Diagnosis Date   Depression     There are no active problems to display for this patient.   Past Surgical History:  Procedure Laterality Date   BREAST BIOPSY Right 01/25/2014   TUBAL LIGATION      OB History   No obstetric history on file.      Home Medications    Prior to Admission medications  Medication Sig Start Date End Date Taking? Authorizing Provider  albuterol  (VENTOLIN  HFA) 108 (90 Base) MCG/ACT inhaler Inhale 1-2 puffs into the lungs every 6 (six) hours as needed. 06/13/24  Yes Corlis Burnard DEL, NP  benzonatate  (TESSALON ) 100 MG capsule Take 1 capsule (100 mg total) by mouth 3 (three) times daily as needed for cough. 06/13/24  Yes Corlis Burnard DEL, NP  buPROPion (WELLBUTRIN XL) 300 MG 24 hr tablet Take 300 mg by mouth daily.   Yes [provider]  diclofenac (VOLTAREN) 75 MG EC tablet Take 75 mg by mouth 2 (two) times daily. 11/09/16  Yes [provider]  escitalopram (LEXAPRO) 20 MG tablet Take 20 mg by mouth daily.   Yes [provider]  Multiple Vitamin (MULTI VITAMIN DAILY PO) Take by mouth.   Yes [provider]  omeprazole  (PRILOSEC) 40 MG capsule Take 1 capsule (40 mg total) by mouth in the morning 30 minutes before  meal 11/10/22  Yes    promethazine -dextromethorphan (PROMETHAZINE -DM) 6.25-15 MG/5ML syrup Take 5 mLs by mouth 4 (four) times daily as needed. 06/13/24  Yes Corlis Burnard DEL, NP  tirzepatide  (ZEPBOUND ) 15 MG/0.5ML Pen Inject 15 mg into the skin once a week. 03/08/23  Yes   Vitamin D, Ergocalciferol, (DRISDOL) 50000 units CAPS capsule Take 50,000 Units by mouth every 7 (seven) days.   Yes [provider]  gabapentin (NEURONTIN) 300 MG capsule Take 300 mg by mouth 1 day or 1 dose. 11/09/16   [provider]  norgestrel-ethinyl estradiol (CRYSELLE-28) 0.3-30 MG-MCG tablet Take 1 tablet by mouth daily. Patient not taking: Reported on 06/13/2024    [provider]  Semaglutide -Weight Management (WEGOVY ) 0.25 MG/0.5ML SOAJ Inject 0.25 mg into the skin once a week. Patient not taking: Reported on 06/13/2024 06/24/22     Semaglutide -Weight Management (WEGOVY ) 0.5 MG/0.5ML SOAJ Inject 0.5 mg into the skin once a week. Patient not taking: Reported on 06/13/2024 06/24/22     tirzepatide  (ZEPBOUND ) 10 MG/0.5ML Pen Inject 10 mg into the skin once a week. 10/06/22     tirzepatide  (ZEPBOUND ) 10 MG/0.5ML Pen Inject 10 mg into the skin once a week. 11/02/22     tirzepatide  (ZEPBOUND ) 12.5 MG/0.5ML Pen Inject 12.5 mg into the skin once a week. 01/25/23     tirzepatide  (ZEPBOUND ) 12.5 MG/0.5ML Pen Inject 12.5  mg into the skin once a week. 02/16/23     tirzepatide  (ZEPBOUND ) 15 MG/0.5ML Pen Inject 15 mg into the skin once a week. 04/19/23     tirzepatide  (ZEPBOUND ) 15 MG/0.5ML Pen Inject 15 mg into the skin once a week. 06/15/23     tirzepatide  (ZEPBOUND ) 15 MG/0.5ML Pen Inject 15 mg into the skin once a week. 07/06/23     tirzepatide  (ZEPBOUND ) 15 MG/0.5ML Pen Inject 15 mg into the skin once a week. 10/26/23     tirzepatide  (ZEPBOUND ) 15 MG/0.5ML Pen Inject 15 mg into the skin once a week. 11/16/23     tirzepatide  (ZEPBOUND ) 15 MG/0.5ML Pen Inject 15 mg into the skin once a week. 02/15/24     tirzepatide  (ZEPBOUND ) 15 MG/0.5ML Pen  Inject 15 mg into the skin once a week. 04/23/24       Family History Family History  Problem Relation Age of Onset   Breast cancer Neg Hx     Social History Social History[1]   Allergies   Paxil [paroxetine hcl] and Vortioxetine   Review of Systems Review of Systems  Constitutional:  Positive for fever. Negative for chills.  HENT:  Positive for postnasal drip and rhinorrhea. Negative for ear pain and sore throat.   Respiratory:  Positive for cough and shortness of breath.   Neurological:  Positive for headaches.     Physical Exam Triage Vital Signs ED Triage Vitals [06/13/24 0933]  Encounter Vitals Group     BP 115/79     Girls Systolic BP Percentile      Girls Diastolic BP Percentile      Boys Systolic BP Percentile      Boys Diastolic BP Percentile      Pulse Rate (!) 105     Resp 18     Temp 98.8 F (37.1 C)     Temp src      SpO2 95 %     Weight      Height      Head Circumference      Peak Flow      Pain Score 0     Pain Loc      Pain Education      Exclude from Growth Chart    No data found.  Updated Vital Signs BP 115/79   Pulse (!) 105   Temp 98.8 F (37.1 C)   Resp 18   LMP  (LMP Unknown)   SpO2 95%   Visual Acuity Right Eye Distance:   Left Eye Distance:   Bilateral Distance:    Right Eye Near:   Left Eye Near:    Bilateral Near:     Physical Exam Constitutional:      General: She is not in acute distress. HENT:     Right Ear: Tympanic membrane normal.     Left Ear: Tympanic membrane normal.     Nose: Rhinorrhea present.     Mouth/Throat:     Mouth: Mucous membranes are moist.     Pharynx: Oropharynx is clear.  Cardiovascular:     Rate and Rhythm: Normal rate and regular rhythm.     Heart sounds: Normal heart sounds.  Pulmonary:     Effort: Pulmonary effort is normal. No respiratory distress.     Breath sounds: Normal breath sounds.  Neurological:     Mental Status: She is alert.      UC Treatments / Results   Labs (all labs ordered are listed, but only abnormal results  are displayed) Labs Reviewed - No data to display  EKG   Radiology No results found.  Procedures Procedures (including critical care time)  Medications Ordered in UC Medications - No data to display  Initial Impression / Assessment and Plan / UC Course  I have reviewed the triage vital signs and the nursing notes.  Pertinent labs & imaging results that were available during my care of the patient were reviewed by me and considered in my medical decision making (see chart for details).    Viral URI.  Patient has been symptomatic for 4 days.  Afebrile.  Lungs are clear and O2 sat is 95% on room air.  Treating with albuterol  inhaler, Tessalon  Perles, Promethazine  DM.  Precautions for drowsiness with promethazine  discussed.  Education provided on viral respiratory infection.  Instructed patient to follow-up with her PCP.  ED precautions given.  She agrees to plan of care.  Final Clinical Impressions(s) / UC Diagnoses   Final diagnoses:  Viral URI     Discharge Instructions      Use the albuterol  inhaler as directed.  Take the Tessalon  Perles as directed.    Take the Promethazine  DM as directed.  Do not drive, operate machinery, drink alcohol, or perform dangerous activities while taking this medication as it may cause drowsiness.  Follow up with your primary care provider.  Go to the emergency department if you have worsening symptoms.        ED Prescriptions     Medication Sig Dispense Auth. Provider   albuterol  (VENTOLIN  HFA) 108 (90 Base) MCG/ACT inhaler Inhale 1-2 puffs into the lungs every 6 (six) hours as needed. 18 g Corlis Burnard DEL, NP   benzonatate  (TESSALON ) 100 MG capsule Take 1 capsule (100 mg total) by mouth 3 (three) times daily as needed for cough. 21 capsule Corlis Burnard DEL, NP   promethazine -dextromethorphan (PROMETHAZINE -DM) 6.25-15 MG/5ML syrup Take 5 mLs by mouth 4 (four) times daily as needed.  118 mL Corlis Burnard DEL, NP      PDMP not reviewed this encounter.    [1]  Social History Tobacco Use   Smoking status: Never   Smokeless tobacco: Never  Vaping Use   Vaping status: Never Used  Substance Use Topics   Alcohol use: Never   Drug use: Never     Corlis Burnard DEL, NP 06/13/24 1003  "
